# Patient Record
Sex: Male | Born: 1985 | Race: White | Hispanic: No | Marital: Married | Smoking: Never smoker
Health system: Southern US, Community
[De-identification: ages and names within clinical notes are randomized; demographics above are authoritative.]

---
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
## 2004-11-21 ENCOUNTER — Encounter: Admission: RE | Admit: 2004-11-21 | Discharge: 2004-11-21 | Payer: Self-pay | Admitting: Occupational Medicine

## 2005-04-09 ENCOUNTER — Emergency Department (HOSPITAL_COMMUNITY): Admission: EM | Admit: 2005-04-09 | Discharge: 2005-04-09 | Payer: Self-pay | Admitting: Emergency Medicine

## 2005-04-12 ENCOUNTER — Ambulatory Visit (HOSPITAL_BASED_OUTPATIENT_CLINIC_OR_DEPARTMENT_OTHER): Admission: RE | Admit: 2005-04-12 | Discharge: 2005-04-12 | Payer: Self-pay | Admitting: *Deleted

## 2006-02-12 ENCOUNTER — Ambulatory Visit: Payer: Self-pay | Admitting: Pulmonary Disease

## 2006-02-28 ENCOUNTER — Ambulatory Visit (HOSPITAL_BASED_OUTPATIENT_CLINIC_OR_DEPARTMENT_OTHER): Admission: RE | Admit: 2006-02-28 | Discharge: 2006-02-28 | Payer: Self-pay | Admitting: Pulmonary Disease

## 2006-03-29 ENCOUNTER — Ambulatory Visit: Payer: Self-pay | Admitting: Pulmonary Disease

## 2006-04-18 ENCOUNTER — Ambulatory Visit: Payer: Self-pay | Admitting: Pulmonary Disease

## 2008-05-07 ENCOUNTER — Ambulatory Visit: Payer: Self-pay | Admitting: Gastroenterology

## 2008-06-05 ENCOUNTER — Encounter (INDEPENDENT_AMBULATORY_CARE_PROVIDER_SITE_OTHER): Payer: Self-pay | Admitting: Interventional Radiology

## 2008-06-05 ENCOUNTER — Ambulatory Visit (HOSPITAL_COMMUNITY): Admission: RE | Admit: 2008-06-05 | Discharge: 2008-06-05 | Payer: Self-pay | Admitting: Gastroenterology

## 2010-07-26 LAB — CBC
HCT: 48.8 % (ref 39.0–52.0)
Hemoglobin: 16.6 g/dL (ref 13.0–17.0)
MCHC: 34 g/dL (ref 30.0–36.0)
MCV: 91 fL (ref 78.0–100.0)
Platelets: 164 10*3/uL (ref 150–400)
RBC: 5.36 MIL/uL (ref 4.22–5.81)
RDW: 13 % (ref 11.5–15.5)
WBC: 6.6 10*3/uL (ref 4.0–10.5)

## 2010-07-26 LAB — PROTIME-INR
INR: 1 (ref 0.00–1.49)
Prothrombin Time: 13.3 seconds (ref 11.6–15.2)

## 2010-08-26 NOTE — Procedures (Signed)
[name], Glenn Leblanc NO.:  [id number]   MEDICAL RECORD NO.:  [id number]          PATIENT TYPE:  OUT   LOCATION:  SLEEP CENTER                 FACILITY:  [facility]   PHYSICIAN:  Barbaraann Share, MD,FCCPDATE OF BIRTH:  1983/08/25   DATE OF STUDY:  02/28/2006                            NOCTURNAL POLYSOMNOGRAM   EPWORTH SCORE:  10.   INDICATION FOR STUDY:  Hypersomnia with sleep apnea.   EPWORTH SLEEPINESS SCORE:  10.   SLEEP ARCHITECTURE:  The patient had a total sleep time of 393 minutes  with adequate slow wave sleep as well as REM. Sleep onset latency was  normal at 19 minutes and REM onset as well at 86 minutes. Sleep  efficiency was fairly good at 90%.   RESPIRATORY DATA:  The patient was found to have 3 hypopnea's and 19  apnea's for a respiratory disturbance index of 3.4 events per hour. The  events were not positional but there was moderate to very severe snoring  noted throughout. The patient was found to have large numbers of  nonspecific arousals and an abnormal PTAF tracing consistent with the  upper airway resistance syndrome.   OXYGEN DATA:  There was O2 desaturation as low as 93% with the patient's  obstructive events.   CARDIAC DATA:  No clinically significant cardiac arrhythmias.   MOVEMENT-PARASOMNIA:  None.   IMPRESSIONS-RECOMMENDATION:  Small numbers of obstructive events which  do not meet the RDI criteria for the obstructive sleep apnea syndrome.  However the patient did have very severe snoring with large numbers of  nonspecific arousals and pressure tracings consistent with the upper  airway resistance syndrome. Clinical correlation is suggested.  Treatment for this may include weight loss alone if indicated, upper  airway surgery, oral appliance, and also CPAP.      Barbaraann Share, MD,FCCP  Diplomate, American Board of Sleep  Medicine     K[facility]/MEDQ  D:  03/23/2006 15:54:54  T:  03/23/2006 22:20:09  Job:  [id number]

## 2010-08-26 NOTE — Op Note (Signed)
NAMEGEVIN, PEREA           ACCOUNT NO.:  [id number]   MEDICAL RECORD NO.:  [id number]          PATIENT TYPE:  AMB   LOCATION:  DSC                          FACILITY:  [facility]   PHYSICIAN:  Tennis Must Meyerdierks, M.D.DATE OF BIRTH:  1985-05-29   DATE OF PROCEDURE:  04/12/2005  DATE OF DISCHARGE:                                 OPERATIVE REPORT   PREOPERATIVE DIAGNOSIS:  Laceration of extensor tendon, right long finger.   POSTOPERATIVE DIAGNOSIS:  Laceration of extensor tendon, right long finger.   PROCEDURE:  Repair of extensor tendon, right long finger, with pinning of  distal interphalangeal joint.   SURGEON:  Lowell Bouton, M.D.   ANESTHESIA:  0.5% Marcaine local with sedation.   OPERATIVE FINDINGS:  The patient had a jagged laceration that extended  through the skin and down to the DIP joint of the right long finger.  The  extensor tendon was completely transected at its insertion.   PROCEDURE:  Under 0.5% Marcaine local anesthesia, with a tourniquet on the  right arm, the right hand was prepped and draped in the usual fashion and  after exsanguinating the limb, the tourniquet was inflated to 250 mmHg.  The  wound edges were debrided with scissors.  The laceration was then extended  proximally in a zigzag fashion and carried down through subcutaneous tissues  to the tendon.  The DIP joint was irrigated out copiously with saline.  The  extensor tendon was found to be transected right at its insertion and so the  DIP joint was pinned in full extension with a 0.45 K-wire.  The K-wire was  bent over and left protruding from the skin.  The tendon was then  reapproximated using a 4-0 nylon suture through the skin, through the tendon  and through the distal tendon and the skin.  Three sutures were inserted.  A  fourth suture was then inserted in the where the incision had been extended.  The wound was then dressed with sterile dressings and an Alumafoam splint.  The patient went to recovery room, awake and stable, in good condition.      Lowell Bouton, M.D.  Electronically Signed     EMM/MEDQ  D:  04/12/2005  T:  04/13/2005  Job:  [id number]

## 2010-08-26 NOTE — Assessment & Plan Note (Signed)
[facility]                               PULMONARY OFFICE NOTE   Glenn Leblanc, Glenn Leblanc                  MRN:          [id number]  DATE:02/12/2006                            DOB:          1985/06/23    SLEEP MEDICINE CONSULTATION   DATE OF CONSULTATION:  February 12, 2006.   HISTORY OF PRESENT ILLNESS:  The patient is a 25 year old white male who I  have been asked to see for possible obstructive sleep apnea.  The patient  recently underwent IV sedation for wisdom teeth extraction and was noted to  have significant upper airway obstruction.  The procedure was cancelled and  the patient was scheduled for evaluation here in this office.  The patient  states that he has been noted to have loud snoring and occasional pauses  according to his wife.  He denies any choking arousals. He typically gets to  bed between 10 and 11 and gets up at 5:20 to start his day.  He is only  rested approximately 50% of the time when arising.  The patient works as a  Curator and denies alertness issues during the day but  obviously does not have a lot of periods of inactivity to judge.  He does  state that he will fall asleep very easily with TV and movies in the  evenings, and his wife feels that in his spare time he falls asleep way to  easily during the day.  He denies any difficulties with sleeping while  driving.  Of note, the patient's weight is up about 10 pounds over the last  few years.   PAST MEDICAL HISTORY:  1. Significant for back surgery in 1991.  2. History of hepatitis C for which he has been treated in [place] for      two years and currently is not on treatment.   CURRENT MEDICATIONS:  None.   ALLERGIES:  Patient has no known drug allergies.   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  He has a history of smoking one pack per day for 7 years. He has not  smoked since April of 2007.   FAMILY HISTORY:  Noncontributory.   REVIEW OF SYSTEMS:  As per history of present illness.  Also see patient's  intake form documented in the chart.   PHYSICAL EXAMINATION:  GENERAL APPEARANCE:  In general, he is a well-  developed, white male in no acute distress.  VITAL SIGNS:  Blood pressure 108/76, pulse is 69.  Temperature 97.8, weight  is 216 pounds.  Oxygen saturation on room air is 96%.  HEENT:  Pupils equal, round, reactive to light and accommodation.  Extraocular movements intact.  Nares show septal deviation to the left with  narrowing.  Oropharynx does show significant elongation of the uvula with  some elongation of his palate.  NECK:  Supple without jugular venous distention or lymphadenopathy.  There  is no palpable thyromegaly.  CHEST:  Totally clear.  CARDIAC:  Exam reveals regular rate and rhythm, no murmurs, rubs or gallops.  ABDOMEN:  Soft, nontender with good bowel sounds.  GENITOURINARY/BREAST/RECTAL:  Exam's not done, not indicated.  EXTREMITIES:  Lower extremities are without edema, good pulses distally with  no calf tenderness.  NEUROLOGICAL:  He is alert and oriented with no obvious motor deficits.   IMPRESSION:  Probable obstructive sleep apnea.  The patient gives a very  good history for this.  His upper airway anatomy is, indeed, abnormal.  I  really think he would benefit from having a sleep study.  The patient is  agreeable to this.   PLAN:  1. Schedule for nocturnal polysomnogram.  2. In regards to his risk for wisdom teeth extraction, there is no reason      why the surgery cannot be done, as long as it is under more controlled      circumstances.  I would recommend that he have his surgery either at a      surgical center, where he can be watched very closely in the      postoperative period, or possibly at [facility].  I will leave that to      the discretion of Dr. Monia Pouch.  Certainly, if he has difficulties with      his airway in the  postoperative period, he will then be in  more      supervised environment and can be placed on CPAP or BiPAP if necessary.  3. The patient will be called as soon as his sleep study is done so that      we can review further.    ______________________________  Barbaraann Share, MD,FCCP    KMC/MedQ  DD: 02/12/2006  DT: 02/13/2006  Job #: [id number]   cc:   Dora Sims, M.D.

## 2010-08-26 NOTE — Assessment & Plan Note (Signed)
[facility]                             PULMONARY OFFICE NOTE   Glenn, Leblanc                    MRN:          [id number]  DATE:04/18/2006                            DOB:          1985/06/17    SUBJECTIVE:  Glenn Leblanc comes in today after his recent sleep study  that was done for snoring and daytime sleepiness.  He was found to have  three hypopneas and 19 apneas for respiratory disturbance index of three  events per hour.  He had moderate to very severe snoring and large  numbers of nonspecific arousals.  This is very suggestive of the upper  airway resistant syndrome.  I have had a long discussion with the  patient about his sleep study and have answered all of his questions.   PHYSICAL EXAMINATION:  GENERAL:  In general he is an overweight male in  no acute distress.  VITAL SIGNS:  Blood pressure is 114/64, pulse 61, temperature 98.3,  weight is 215 pounds, O2 saturation on room air is 97%.   IMPRESSION:  1. 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  The patient gives a      fairly good history for this, as the typical nocturnal      polysomnogram that is suggestive of this, and has abnormal upper      airway anatomy.  Because he is so symptomatic and really does not      have that much weight to lose, he can consider upper airway      surgery, oral appliance, or possibly CPAP.  The patient and I both      agreed that it may be worthwhile to have otolaryngology see the      patient and evaluate his upper airway to see about the possibility      of upper airway surgery for this problem.  The patient is agreeable      to this approach.   PLAN:  1. Will refer to Dr. Jenne Pane of [facility] to consider upper airway      surgery for his upper airway resistant syndrome.  2. Work on weight loss in the interim.  3. The patient will contact me after his ENT evaluation and after he      has decided treatment for his sleep disorder  breathing.     Barbaraann Share, MD,FCCP  Electronically Signed    KMC/MedQ  DD: 04/18/2006  DT: 04/18/2006  Job #: [id number]   cc:   Dora Sims, DDS  Antony Contras, MD

## 2014-07-06 ENCOUNTER — Other Ambulatory Visit (HOSPITAL_COMMUNITY): Payer: Self-pay | Admitting: Nurse Practitioner

## 2014-07-06 DIAGNOSIS — B182 Chronic viral hepatitis C: Secondary | ICD-10-CM

## 2014-07-28 ENCOUNTER — Ambulatory Visit (HOSPITAL_COMMUNITY)
Admission: RE | Admit: 2014-07-28 | Discharge: 2014-07-28 | Disposition: A | Discharge status: Home / Self Care | Payer: 59 | Source: Ambulatory Visit | Attending: Nurse Practitioner | Admitting: Nurse Practitioner

## 2014-07-28 DIAGNOSIS — B182 Chronic viral hepatitis C: Secondary | ICD-10-CM

## 2014-10-29 ENCOUNTER — Ambulatory Visit (INDEPENDENT_AMBULATORY_CARE_PROVIDER_SITE_OTHER): Payer: Worker's Compensation | Admitting: Family Medicine

## 2014-10-29 VITALS — BP 118/82 | HR 71 | Temp 98.0°F | Resp 16 | Ht 73.5 in | Wt 227.0 lb

## 2014-10-29 DIAGNOSIS — T1591XA Foreign body on external eye, part unspecified, right eye, initial encounter: Secondary | ICD-10-CM

## 2014-10-29 DIAGNOSIS — H5711 Ocular pain, right eye: Secondary | ICD-10-CM | POA: Diagnosis not present

## 2014-10-29 NOTE — Patient Instructions (Addendum)
You have a few specks of metal in the right eye. We were able to remove one but are having you see ophthalmology for the remainder.   You have an appt with Dr. Dione Booze today. Office is [facility] [address] [place] ph# [phone]

## 2014-10-29 NOTE — Progress Notes (Signed)
   Subjective:    Patient ID: Glenn Leblanc, male    DOB: 03-04-1986, 29 y.o.   MRN: [id number]  Chief Complaint  Patient presents with  . redness in right eye    Started yesterday at work   Medications, allergies, past medical history, surgical history, family history, social history and problem list reviewed and updated.  HPI  Fbs? Photophobia? Dc?  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 Intermittently bothersome last night and this am. Eye feels scratchy. Gets fbs right eye intermittently. Mild photophobia. Eye watering but no purulent dc. No uri sx. No fevers, chills. Not contact wearer.   Review of Systems See HPI.    Objective:   Physical Exam  Constitutional: He is oriented to person, place, and time.  BP 118/82 mmHg  Pulse 71  Temp(Src) 98 F (36.7 C) (Oral)  Resp 16  Ht 6' 1.5" (1.867 m)  Wt 227 lb (102.967 kg)  BMI 29.54 kg/m2  SpO2 98%   Eyes: Foreign body present in the right eye.    Two small foreign bodies noted right eye medial to pupil. One eye removed after proparacaine drops and fluorescein applied. Normal pupillary constrictions. Unable to remove 2nd foreign body. Rust ring noted around speck.   Vision: 20/15 left, 20/25 right.   Neurological: He is alert and oriented to person, place, and time.      Assessment & Plan:   Foreign body in eye, right, initial encounter - Plan: Ambulatory referral to Ophthalmology --one fr removed, urgent referral to [facility] for further removal as unable to removed 2nd fb in clinic --pt leaving here to go to Gae Gallop, PA-C Physician Assistant-Certified [facility]  10/29/2014 11:44 AM

## 2014-11-03 NOTE — Progress Notes (Signed)
Patient ID: Glenn Leblanc, male   DOB: [birth year], 29 y.o.   MRN: [id number] Pt assessed independently by myself with flouriscien stain - i was not able to accurately identify the foreign body to try to remove so will try to have pt worked into optho today for further eval and removal of FB on cornea  WC case. reviewed documentation and agree w/ assessment and plan. Norberto Sorenson, MD MPH

## 2016-12-15 DIAGNOSIS — Z0000 Encounter for general adult medical examination without abnormal findings: Secondary | ICD-10-CM | POA: Diagnosis not present

## 2016-12-15 DIAGNOSIS — Z23 Encounter for immunization: Secondary | ICD-10-CM | POA: Diagnosis not present

## 2016-12-15 DIAGNOSIS — Z8619 Personal history of other infectious and parasitic diseases: Secondary | ICD-10-CM | POA: Diagnosis not present

## 2016-12-15 DIAGNOSIS — Z13220 Encounter for screening for lipoid disorders: Secondary | ICD-10-CM | POA: Diagnosis not present

## 2017-05-17 DIAGNOSIS — L918 Other hypertrophic disorders of the skin: Secondary | ICD-10-CM | POA: Diagnosis not present

## 2017-05-17 DIAGNOSIS — L720 Epidermal cyst: Secondary | ICD-10-CM | POA: Diagnosis not present

## 2017-05-17 DIAGNOSIS — L821 Other seborrheic keratosis: Secondary | ICD-10-CM | POA: Diagnosis not present

## 2017-05-17 DIAGNOSIS — D225 Melanocytic nevi of trunk: Secondary | ICD-10-CM | POA: Diagnosis not present

## 2017-05-24 DIAGNOSIS — Z4802 Encounter for removal of sutures: Secondary | ICD-10-CM | POA: Diagnosis not present

## 2017-06-14 DIAGNOSIS — L720 Epidermal cyst: Secondary | ICD-10-CM | POA: Diagnosis not present

## 2017-06-14 DIAGNOSIS — L723 Sebaceous cyst: Secondary | ICD-10-CM | POA: Diagnosis not present

## 2018-08-10 DIAGNOSIS — S61203A Unspecified open wound of left middle finger without damage to nail, initial encounter: Secondary | ICD-10-CM | POA: Diagnosis not present

## 2018-12-13 DIAGNOSIS — Z6830 Body mass index (BMI) 30.0-30.9, adult: Secondary | ICD-10-CM | POA: Diagnosis not present

## 2018-12-13 DIAGNOSIS — Z8619 Personal history of other infectious and parasitic diseases: Secondary | ICD-10-CM | POA: Diagnosis not present

## 2018-12-13 DIAGNOSIS — Z0000 Encounter for general adult medical examination without abnormal findings: Secondary | ICD-10-CM | POA: Diagnosis not present

## 2021-07-22 ENCOUNTER — Ambulatory Visit (INDEPENDENT_AMBULATORY_CARE_PROVIDER_SITE_OTHER): Payer: BLUE CROSS/BLUE SHIELD

## 2021-07-22 ENCOUNTER — Ambulatory Visit: Payer: BLUE CROSS/BLUE SHIELD | Admitting: Orthopedic Surgery

## 2021-07-22 DIAGNOSIS — M25562 Pain in left knee: Secondary | ICD-10-CM

## 2021-07-23 ENCOUNTER — Encounter: Payer: Self-pay | Admitting: Orthopedic Surgery

## 2021-07-23 NOTE — Progress Notes (Signed)
? ?Office Visit Note ?  ?Patient: Glenn Leblanc           ?Date of Birth: [birth year]           ?MRN: [id number] ?Visit Date: 07/22/2021 ?Requested by: No referring provider defined for this encounter. ?PCP: No primary care provider on file. ? ?Subjective: ?Chief Complaint  ?Patient presents with  ? Left Knee - Pain  ? ? ?HPI: Glenn Leblanc is a 36 year old patient who walked into a piece of pipe December 2022.  Had a significant impact injury with a small laceration longitudinally adjacent to the patellar tendon at that time that occurred through his jeans.  Has described significant retropatellar pain since that time.  The pain does not wake him from sleep at night.  He has good and bad days.  A lot of sharp pain with kneeling.  Relatively constant dull ache in that knee.  Describes increased pain with kneeling and getting up and down.  No prior left knee surgery.  Symptoms ongoing for 4 months.  When he goes from sitting to standing his symptoms are the worst.  Hard for him to kneel.  Has tried over-the-counter anti-inflammatories as well as a stretching and rehab program that he is down on his own without much relief.  Symptoms ongoing now for greater than 4 months. ?             ?ROS: All systems reviewed are negative as they relate to the chief complaint within the history of present illness.  Patient denies  fevers or chills. ? ? ?Assessment & Plan: ?Visit Diagnoses:  ?1. Left knee pain, unspecified chronicity   ? ? ?Plan: Impression is left knee pain with possible retropatellar synovitis but no obvious fracture on plain radiographs.  Could be a chondral defect.  The medial and lateral compartments appear intact.  Due to duration of symptoms and failure of conservative treatment for over 6 weeks we will request MRI scan of the left knee to evaluate impact trauma to the patellofemoral joint 4 months ago.  Follow-up after that study.  Anticipate either injection versus arthroscopic debridement versus observation  after this scan. ?Follow-Up Instructions: Return for after MRI.  ? ?Orders:  ?Orders Placed This Encounter  ?Procedures  ? XR KNEE 3 VIEW LEFT  ? MR Knee Left w/o contrast  ? ?No orders of the defined types were placed in this encounter. ? ? ? ? Procedures: ?No procedures performed ? ? ?Clinical Data: ?No additional findings. ? ?Objective: ?Vital Signs: There were no vitals taken for this visit. ? ?Physical Exam:  ? ?Constitutional: Patient appears well-developed ?HEENT:  ?Head: Normocephalic ?Eyes:EOM are normal ?Neck: Normal range of motion ?Cardiovascular: Normal rate ?Pulmonary/chest: Effort normal ?Neurologic: Patient is alert ?Skin: Skin is warm ?Psychiatric: Patient has normal mood and affect ? ? ?Ortho Exam: Ortho exam demonstrates intact extensor mechanism on the left.  No effusion in the knee.  Has ? ?Macular tenderness.  Better 4 cm long healed laceration on the medial aspect of the patella tendon is present.  No masses lymphadenopathy or skin changes noted in that left knee region.  No erythema or inflammation or induration around the well-healed incision.  Negative Tinel's around this region as well.  Range of motion full. ? ?Specialty Comments:  ?No specialty comments available. ? ?Imaging: ?XR KNEE 3 VIEW LEFT ? ?Result Date: 07/23/2021 ?AP lateral merchant radiographs left knee reviewed.  No arthritis.  No acute fracture.  No joint space narrowing in the medial  lateral or patellofemoral compartments.  ? ? ?PMFS History: ?There are no problems to display for this patient. ? ?History reviewed. No pertinent past medical history.  ?History reviewed. No pertinent family history.  ?History reviewed. No pertinent surgical history. ?Social History  ? ?Occupational History  ? Not on file  ?Tobacco Use  ? Smoking status: Never  ? Smokeless tobacco: Not on file  ?Substance and Sexual Activity  ? Alcohol use: Yes  ?  Alcohol/week: 1.0 standard drink  ?  Types: 1 Standard drinks or equivalent per week  ? Drug use:  No  ? Sexual activity: Not on file  ? ? ? ? ? ?

## 2021-07-30 ENCOUNTER — Ambulatory Visit
Admission: RE | Admit: 2021-07-30 | Discharge: 2021-07-30 | Disposition: A | Discharge status: Home / Self Care | Payer: BLUE CROSS/BLUE SHIELD | Source: Ambulatory Visit | Attending: Orthopedic Surgery | Admitting: Orthopedic Surgery

## 2021-07-30 DIAGNOSIS — M25562 Pain in left knee: Secondary | ICD-10-CM

## 2021-08-08 NOTE — Progress Notes (Signed)
Can he get f/u?

## 2021-08-10 ENCOUNTER — Ambulatory Visit: Payer: BLUE CROSS/BLUE SHIELD | Admitting: Orthopedic Surgery

## 2021-08-10 DIAGNOSIS — M25562 Pain in left knee: Secondary | ICD-10-CM | POA: Diagnosis not present

## 2021-08-15 ENCOUNTER — Encounter: Payer: Self-pay | Admitting: Orthopedic Surgery

## 2021-08-15 NOTE — Progress Notes (Signed)
? ?  Office Visit Note ?  ?Patient: Glenn Leblanc           ?Date of Birth: 1985/07/25           ?MRN: [id number] ?Visit Date: 08/10/2021 ?Requested by: Blair Heys, MD ?[address] ?[address] ?[place],  [place] [address] ?PCP: Blair Heys, MD ? ?Subjective: ?Chief Complaint  ?Patient presents with  ? Left Knee - Follow-up  ?  Scan review  ? ? ?HPI: Glenn Leblanc is a 36 year old patient with left knee pain.  Since he was last seen he has had a left knee MRI scan which is reviewed.  This shows primarily moderate tendinosis of the patellar tendon most severe at the distal insertion without a tear.  No meniscal or ligamentous injury affecting the left knee.  He actually has been a little better over the past 2 weeks.  He is able to walk about 5 to 6 miles. ?             ?ROS: All systems reviewed are negative as they relate to the chief complaint within the history of present illness.  Patient denies  fevers or chills. ? ? ?Assessment & Plan: ?Visit Diagnoses: No diagnosis found. ? 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  No intra-articular pathology.  Focusing on eccentric strengthening and quadricep stretching is indicated.  Follow-up with [place] as needed. ? ?Follow-Up Instructions: Return if symptoms worsen or fail to improve.  ? ?Orders:  ?No orders of the defined types were placed in this encounter. ? ?No orders of the defined types were placed in this encounter. ? ? ? ? Procedures: ?No procedures performed ? ? ?Clinical Data: ?No additional findings. ? ?Objective: ?Vital Signs: There were no vitals taken for this visit. ? ?Physical Exam:  ? ?Constitutional: Patient appears well-developed ?HEENT:  ?Head: Normocephalic ?Eyes:EOM are normal ?Neck: Normal range of motion ?Cardiovascular: Normal rate ?Pulmonary/chest: Effort normal ?Neurologic: Patient is alert ?Skin: Skin is warm ?Psychiatric: Patient has normal mood and affect ? ? ?Ortho Exam: Ortho exam demonstrates full active  and passive range of motion of the left knee.  Quadriceps not excessively tight.  He is able to get his heel within 6 inches from his buttocks with stretching.  No focal joint line tenderness.  Collateral and cruciate ligaments are stable.  Mild tenderness along the course of the patellar tendon but no focal defect palpable ? ?Specialty Comments:  ?No specialty comments available. ? ?Imaging: ?No results found. ? ? ?PMFS History: ?There are no problems to display for this patient. ? ?History reviewed. No pertinent past medical history.  ?History reviewed. No pertinent family history.  ?History reviewed. No pertinent surgical history. ?Social History  ? ?Occupational History  ? Not on file  ?Tobacco Use  ? Smoking status: Never  ? Smokeless tobacco: Not on file  ?Substance and Sexual Activity  ? Alcohol use: Yes  ?  Alcohol/week: 1.0 standard drink  ?  Types: 1 Standard drinks or equivalent per week  ? Drug use: No  ? Sexual activity: Not on file  ? ? ? ? ? ?

## 2023-07-20 IMAGING — MR MR KNEE*L* W/O CM
5 of 8 series · 21 of 40 positions shown · non-contrast
Comparison: None.

CLINICAL DATA: Patellofemoral pain status post injury 4 months ago.

EXAM:
MRI OF THE LEFT KNEE WITHOUT CONTRAST
TECHNIQUE: Multiplanar, multisequence MR imaging of the knee was performed. No
intravenous contrast was administered.

[Series 3: T2 fat-sat · axial · 4.0mm · 0.50mm/px · z∈[-35,+105]mm · 4 of 29 slices shown (1 of 3)]
[im 1/29]
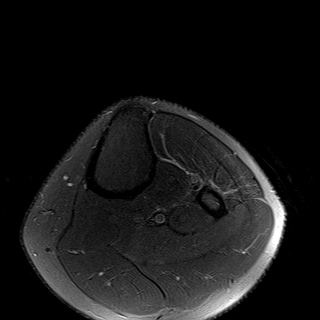
[im 10/29]
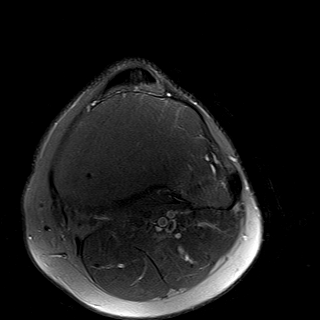
[im 19/29]
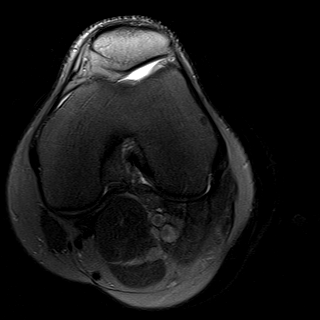
[im 29/29]
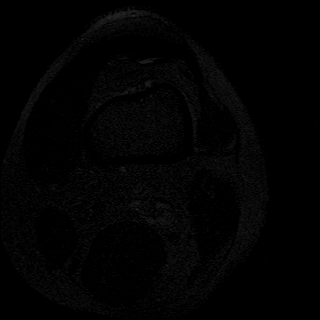

[Series 5: T2 fat-sat · coronal · 4.0mm · 0.29mm/px · 5 of 26 slices shown (2 of 3)]
[im 1/26]
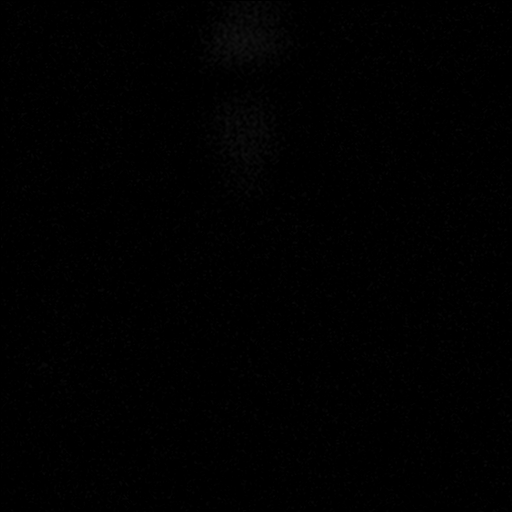
[im 7/26]
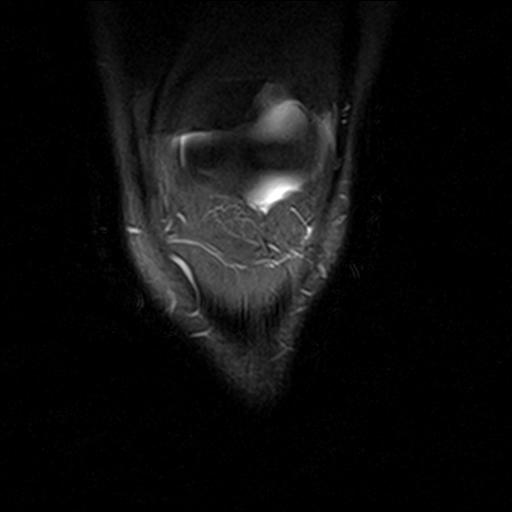
[im 13/26]
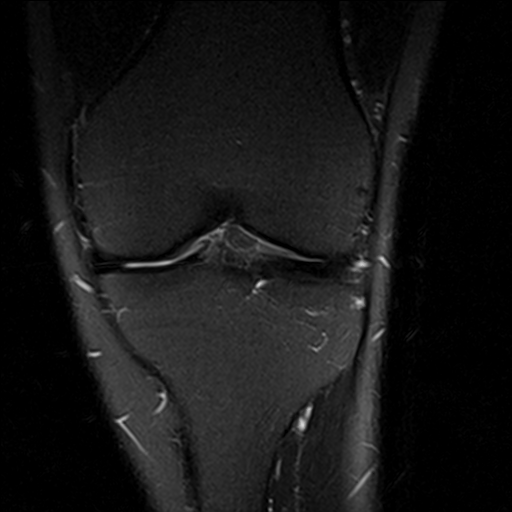
[im 19/26]
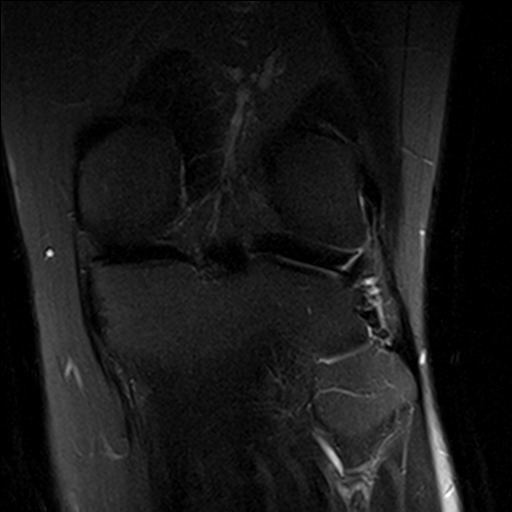
[im 26/26]
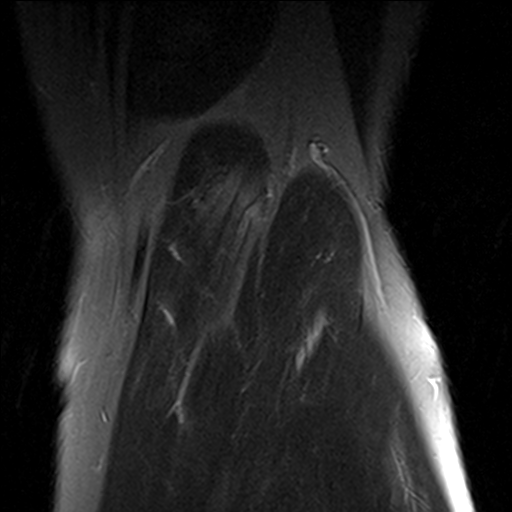

[Series 6: T2 fat-sat · sagittal · 3.0mm · 0.29mm/px · 1 of 28 slices shown (3 of 3)]
[im 1/28]
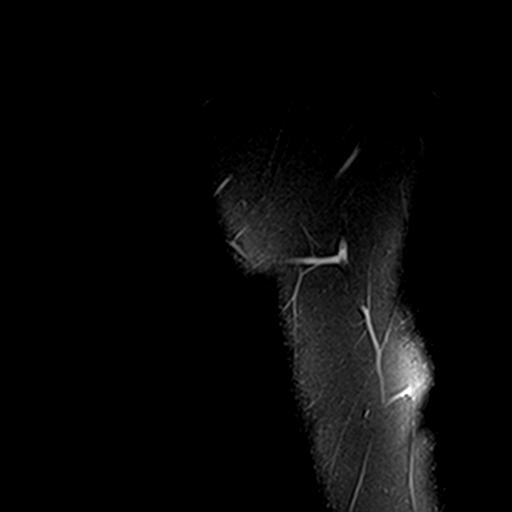

[Series 7: PD fat-sat · sagittal · 3.0mm · 0.29mm/px · 5 of 28 slices shown (1 of 2)]
[im 1/28]
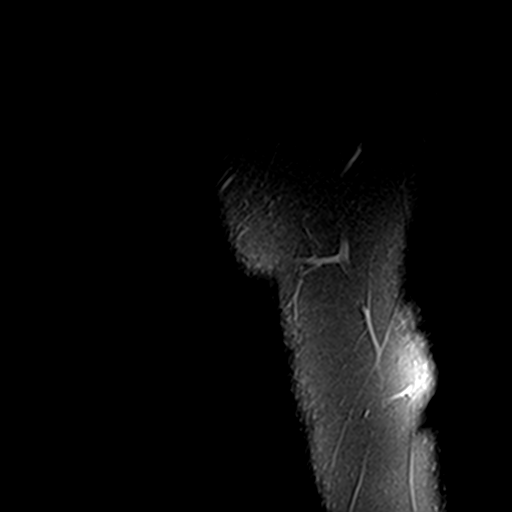
[im 7/28]
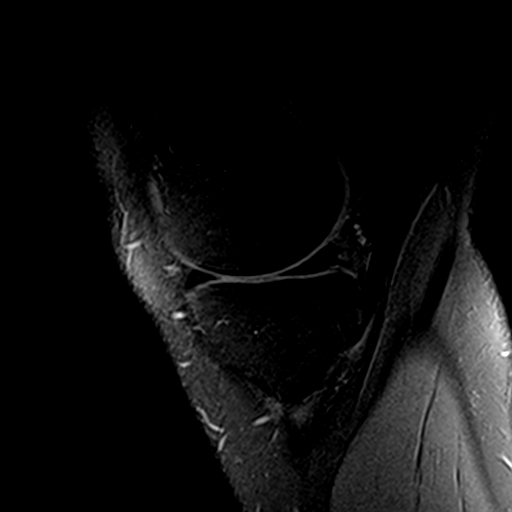
[im 14/28]
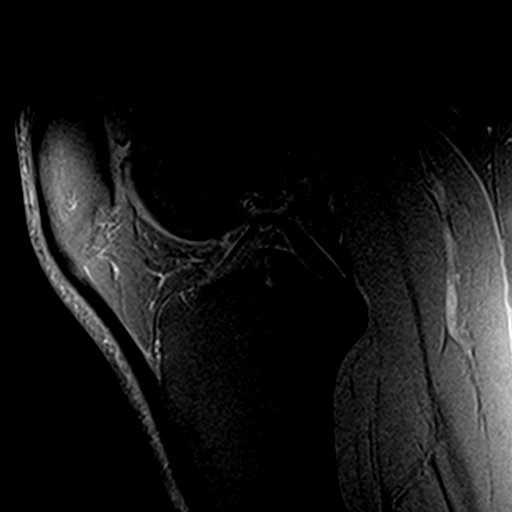
[im 21/28]
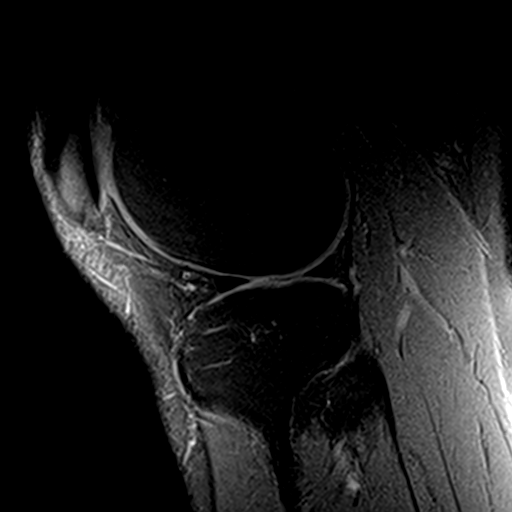
[im 28/28]
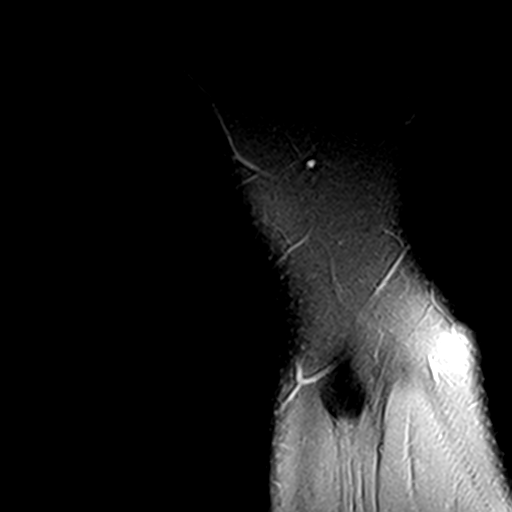

[Series 8: PD fat-sat · coronal · 3.0mm · 0.29mm/px · 6 of 32 slices shown (2 of 2)]
[im 1/32]
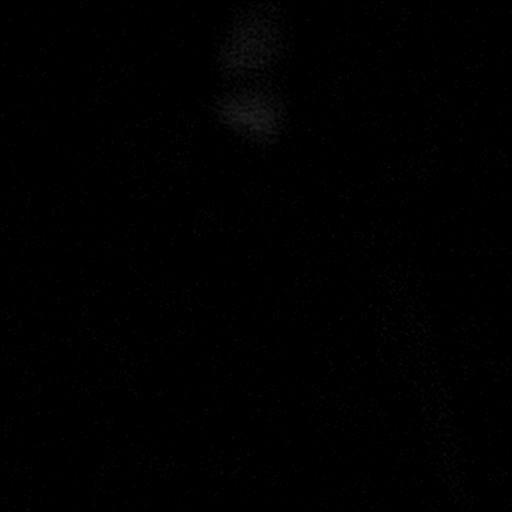
[im 7/32]
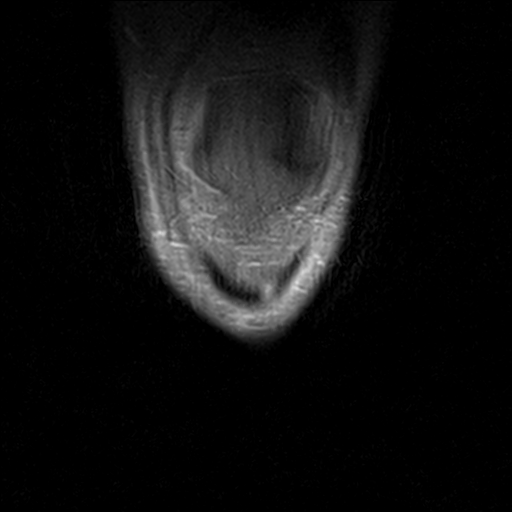
[im 13/32]
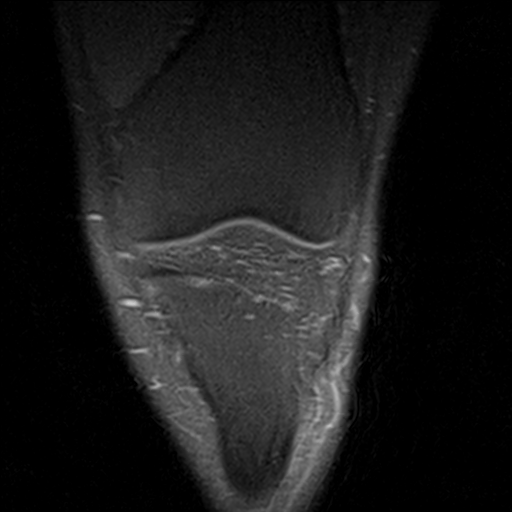
[im 19/32]
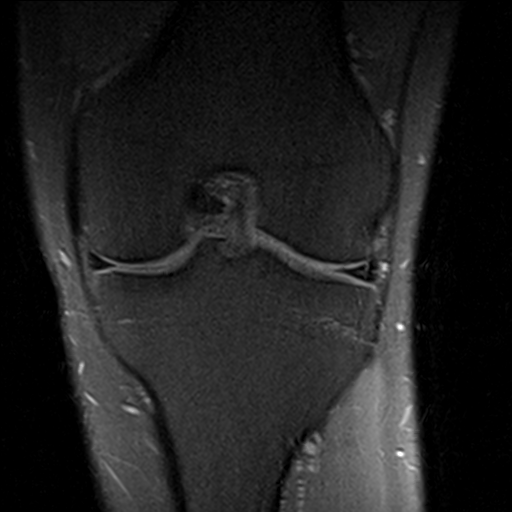
[im 25/32]
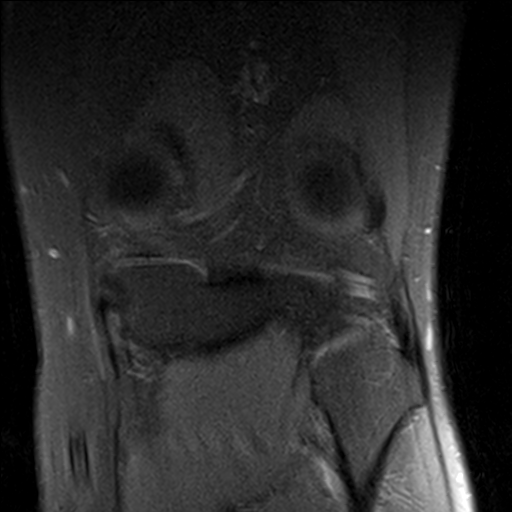
[im 32/32]
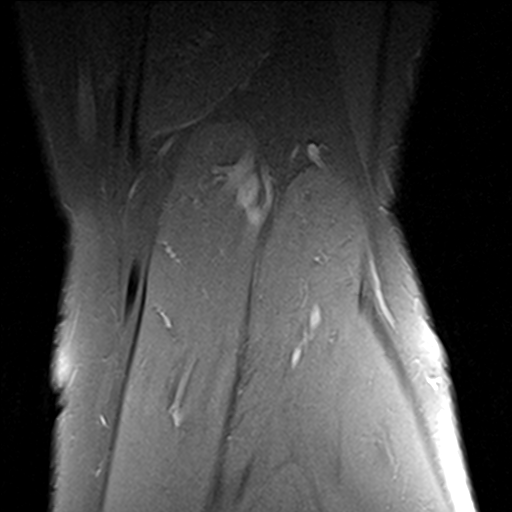

[21 of 40 positions shown; findings below may reference images not displayed]

FINDINGS: MENISCI

Medial: Intact.

Lateral: Intact.

LIGAMENTS

Cruciates: ACL and PCL are intact.

Collaterals: Medial collateral ligament is intact. Lateral
collateral ligament complex is intact.

CARTILAGE

Patellofemoral:  No chondral defect.

Medial:  No chondral defect.

Lateral:  No chondral defect.

JOINT: No joint effusion. Normal Jeane Serra. Prominent medial
patellar plica. Small amount of fluid in the deep infrapatellar
bursa.

POPLITEAL FOSSA: Popliteus tendon is intact. No Baker's cyst.

EXTENSOR MECHANISM: Intact quadriceps tendon. Moderate tendinosis of
the patellar tendon most severe at the distal insertion. No patellar
tendon tear. Intact medial patellar retinaculum. Intact lateral
patellar retinaculum. Intact MPFL.

BONES: No aggressive osseous lesion. No fracture or dislocation.

Other: No fluid collection or hematoma. Muscles are normal.
IMPRESSION: 1. Moderate tendinosis of the patellar tendon most severe at the
distal insertion without a tear.
2. No meniscal or ligamentous injury of the left knee.
